# Patient Record
Sex: Male | Born: 2005 | Race: White | Hispanic: No | Marital: Single | Smoking: Never smoker
Health system: Southern US, Community
[De-identification: ages and names within clinical notes are randomized; demographics above are authoritative.]

## PROBLEM LIST (undated history)

## (undated) DIAGNOSIS — F988 Other specified behavioral and emotional disorders with onset usually occurring in childhood and adolescence: Secondary | ICD-10-CM

## (undated) DIAGNOSIS — F909 Attention-deficit hyperactivity disorder, unspecified type: Secondary | ICD-10-CM

## (undated) HISTORY — PX: OTHER SURGICAL HISTORY: SHX169

## (undated) HISTORY — PX: CIRCUMCISION: SUR203

---
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
## 2024-09-14 ENCOUNTER — Ambulatory Visit: Payer: Self-pay

## 2024-09-14 NOTE — Telephone Encounter (Signed)
 FYI Only or Action Required?: FYI only for provider: Stepmom is taking patient to UC.  Patient was last seen in primary care on 11/12/2021 by Cherylene Homer HERO, NP.  Called Nurse Triage reporting Vomiting.  Symptoms began several days ago.  Interventions attempted: Rest, hydration, or home remedies.  Symptoms are: stable.  Triage Disposition: See Physician Within 24 Hours  Patient/caregiver understands and will follow disposition?: No Reason for Disposition  [1] MILD or MODERATE vomiting AND [2] present > 48 hours (2 days)  (Exception: Mild vomiting with associated diarrhea.)  Answer Assessment - Initial Assessment Questions [name] step mom Whitney calling in today. Offered appointment at [facility] with PCP today or a virtual this afternoon, Whitney declined as it interfered with her work schedule, she stated she will take patient to UC.   1. VOMITING SEVERITY: How many times have you vomited in the past 24 hours?      Step mom unsure, but patient was vomiting Monday and Tuesday and has subsided mostly since Tuesday afternoon  2. ONSET: When did the vomiting begin?      2 days ago  3. FLUIDS: What fluids or food have you vomited up today? Have you been able to keep any fluids down?     Kept fluids down  4. ABDOMEN PAIN: Are your having any abdomen pain? If Yes : How bad is it and what does it feel like? (e.g., crampy, dull, intermittent, constant)      Yes cramping with the diarrhea  5. DIARRHEA: Is there any diarrhea? If Yes, ask: How many times today?      Yes, yesterday and some on Monday  6. CONTACTS: Is there anyone else in the family with the same symptoms?      Unsure  7. CAUSE: What do you think is causing your vomiting?     Unsure  8. HYDRATION STATUS: Any signs of dehydration? (e.g., dry mouth [not only dry lips], too weak to stand) When did you last urinate?     Mild dizziness  9. OTHER SYMPTOMS: Do you have any other symptoms?  (e.g., fever, headache, vertigo, vomiting blood or coffee grounds, recent head injury)     Dizzy this morning. No fever that stepmom is aware of.  Protocols used: Vomiting-A-AH  Copied from CRM [id number]. Topic: Clinical - Red Word Triage >> Sep 14, 2024  8:08 AM Miquel SAILOR wrote: Red Word that prompted transfer to Nurse Triage: PT mother [place] states PT has a stomach virus/Dizziness/Vomiting/ since 01/05/Hard to Sleep

## 2024-09-14 NOTE — Telephone Encounter (Signed)
Advised UC
